# Patient Record
Sex: Female | Born: 1950 | Race: White | Hispanic: No | Marital: Married | Smoking: Former smoker
Health system: Southern US, Community
[De-identification: ages and names within clinical notes are randomized; demographics above are authoritative.]

## PROBLEM LIST (undated history)

## (undated) DIAGNOSIS — F329 Major depressive disorder, single episode, unspecified: Secondary | ICD-10-CM

## (undated) DIAGNOSIS — R0602 Shortness of breath: Secondary | ICD-10-CM

## (undated) DIAGNOSIS — T7840XA Allergy, unspecified, initial encounter: Secondary | ICD-10-CM

## (undated) DIAGNOSIS — K219 Gastro-esophageal reflux disease without esophagitis: Secondary | ICD-10-CM

## (undated) DIAGNOSIS — F32A Depression, unspecified: Secondary | ICD-10-CM

## (undated) HISTORY — DX: Major depressive disorder, single episode, unspecified: F32.9

## (undated) HISTORY — PX: ABDOMINAL HYSTERECTOMY: SHX81

## (undated) HISTORY — DX: Gastro-esophageal reflux disease without esophagitis: K21.9

## (undated) HISTORY — DX: Allergy, unspecified, initial encounter: T78.40XA

## (undated) HISTORY — DX: Shortness of breath: R06.02

## (undated) HISTORY — DX: Depression, unspecified: F32.A

---

## 2003-09-07 ENCOUNTER — Emergency Department (HOSPITAL_COMMUNITY): Admission: EM | Admit: 2003-09-07 | Discharge: 2003-09-07 | Payer: Self-pay

## 2004-02-20 ENCOUNTER — Encounter (INDEPENDENT_AMBULATORY_CARE_PROVIDER_SITE_OTHER): Payer: Self-pay | Admitting: *Deleted

## 2004-02-20 ENCOUNTER — Encounter: Admission: RE | Admit: 2004-02-20 | Discharge: 2004-02-20 | Payer: Self-pay | Admitting: Family Medicine

## 2009-07-18 ENCOUNTER — Encounter: Admission: RE | Admit: 2009-07-18 | Discharge: 2009-07-18 | Payer: Self-pay | Admitting: Family Medicine

## 2011-04-18 IMAGING — CT CT MAXILLOFACIAL W/O CM
3 of 4 series · 16 of 47 positions shown, 19 images · non-contrast
Comparison: None.

CLINICAL DATA: 58-year-old female with chronic sinusitis.
Drainage, sore throat, occasional facial pressure.

CT MAXILLOFACIAL WITHOUT CONTRAST
TECHNIQUE: Multidetector CT imaging of the maxillofacial
structures was performed. Multiplanar CT image reconstructions were
also generated.

[Series 2: ax bone · axial · 0.33mm/px · z∈[-72,+20]mm · 10 of 45 slices shown, 13 images]
[im 4/45  brain]
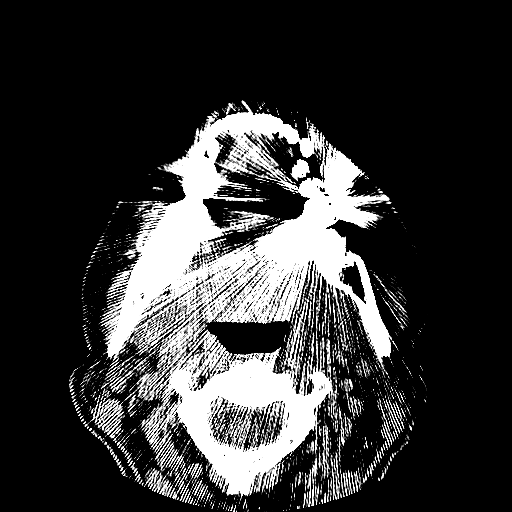
[im 4/45  bone]
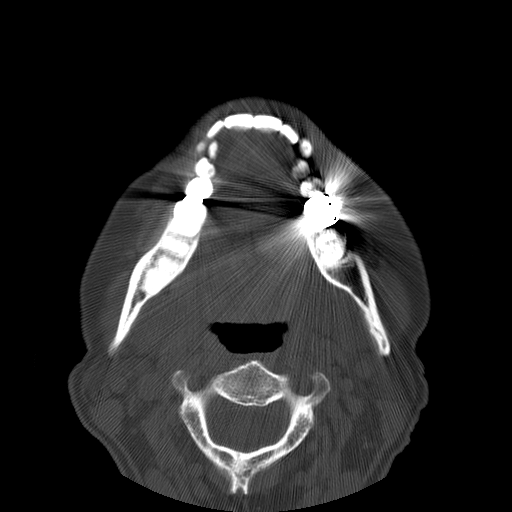
[im 7/45  bone]
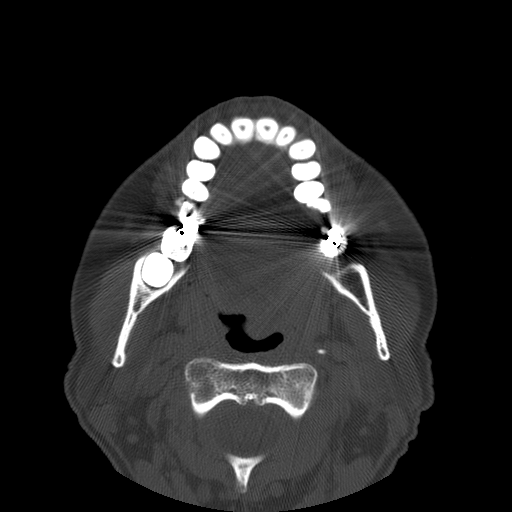
[im 13/45  bone]
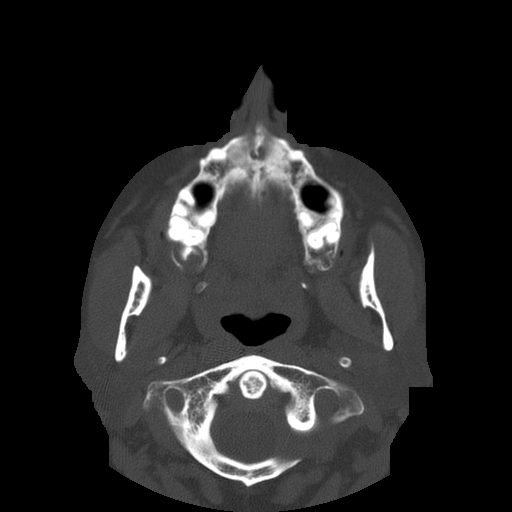
[im 16/45  bone]
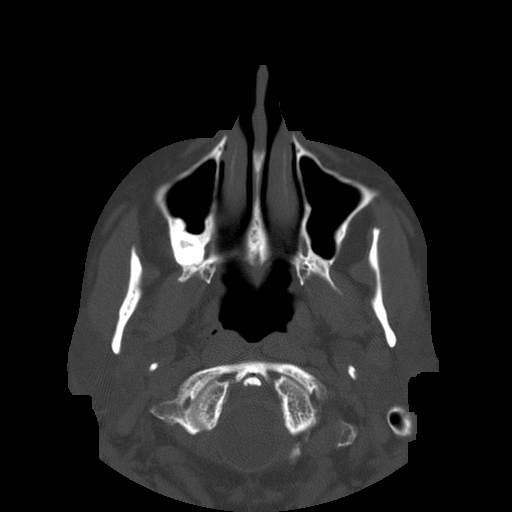
[im 19/45  brain]
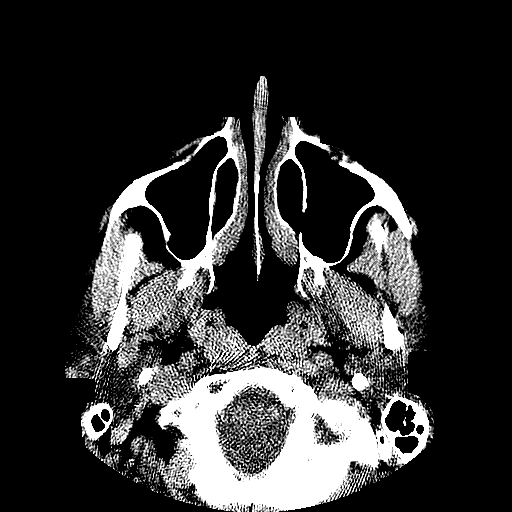
[im 19/45  bone]
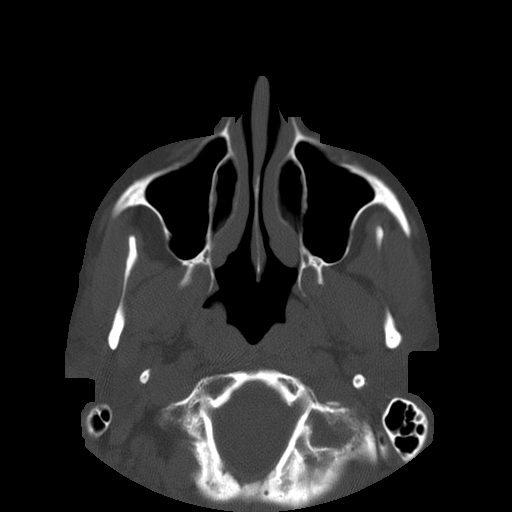
[im 26/45  bone]
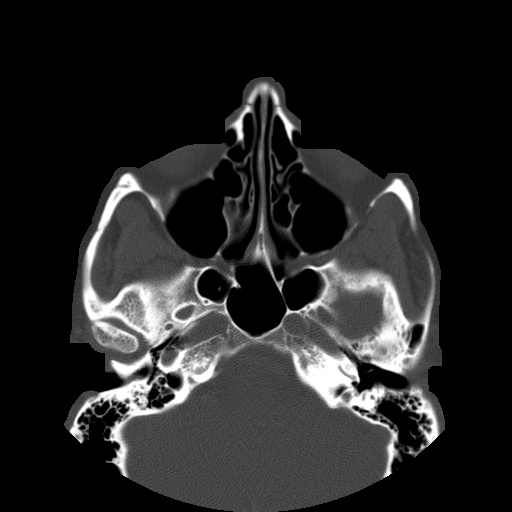
[im 29/45  bone]
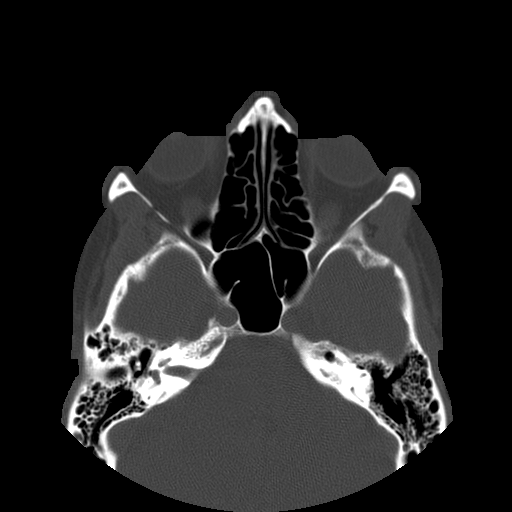
[im 32/45  bone]
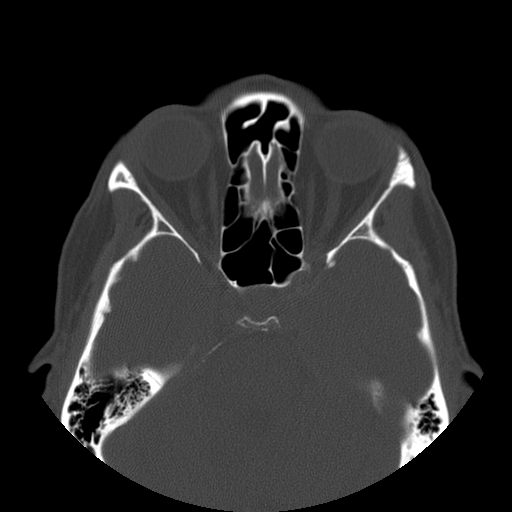
[im 38/45  brain]
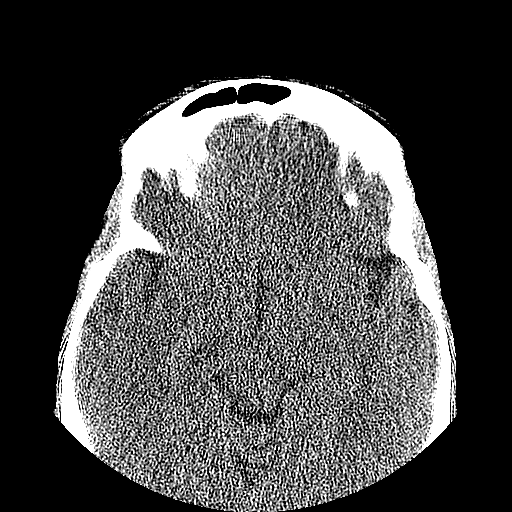
[im 38/45  bone]
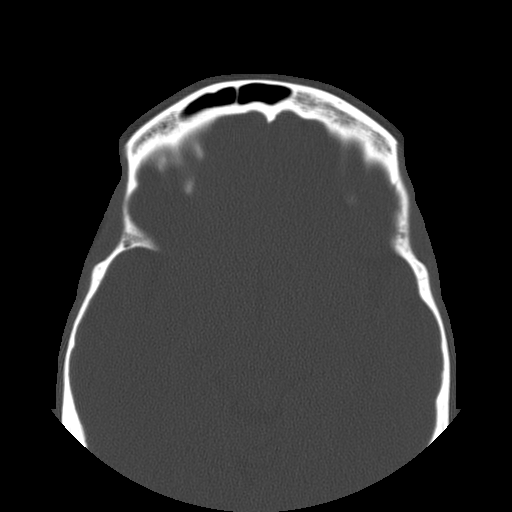
[im 41/45  bone]
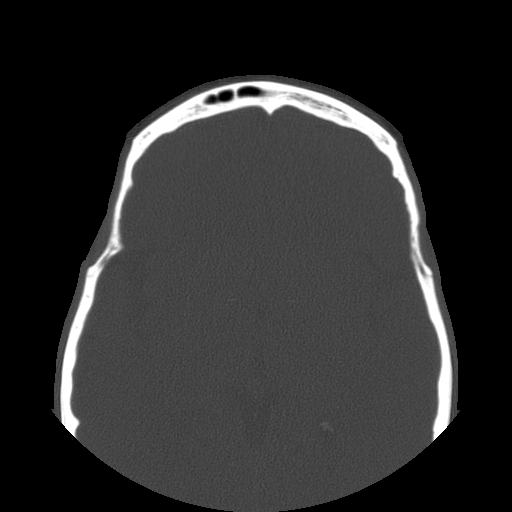

[Series 103: cor soft · coronal · 0.33mm/px · 3 of 59 slices shown]
[im 26/59  bone]
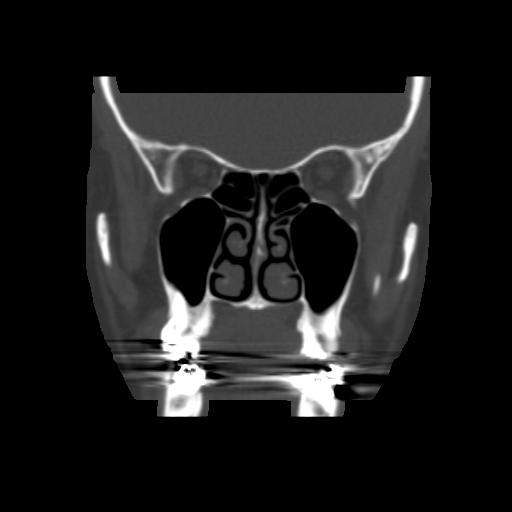
[im 33/59  bone]
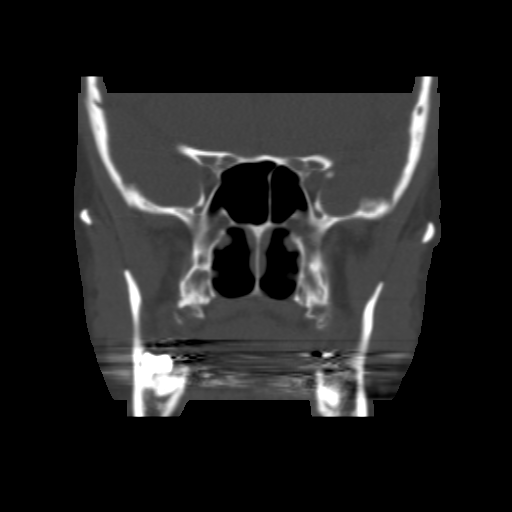
[im 39/59  bone]
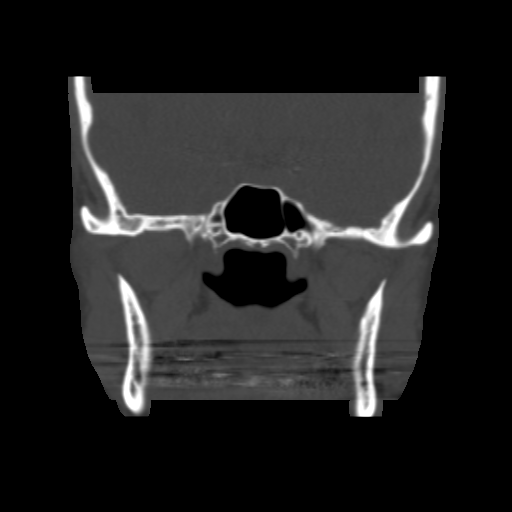

[Series 104: sag soft · sagittal · 0.33mm/px · 3 of 69 slices shown]
[im 23/69  bone]
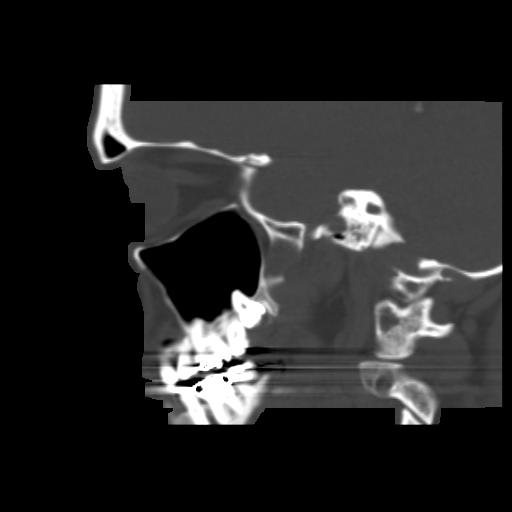
[im 35/69  bone]
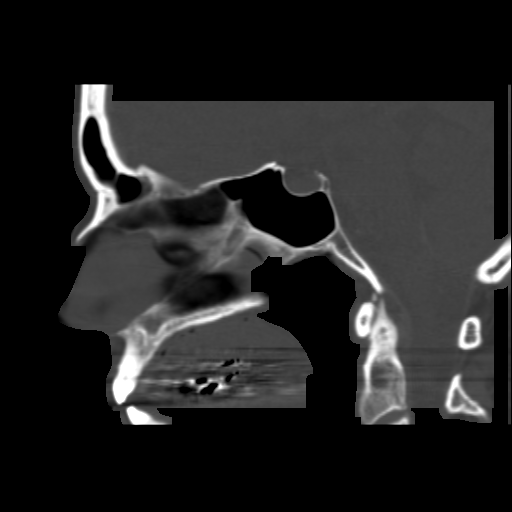
[im 46/69  bone]
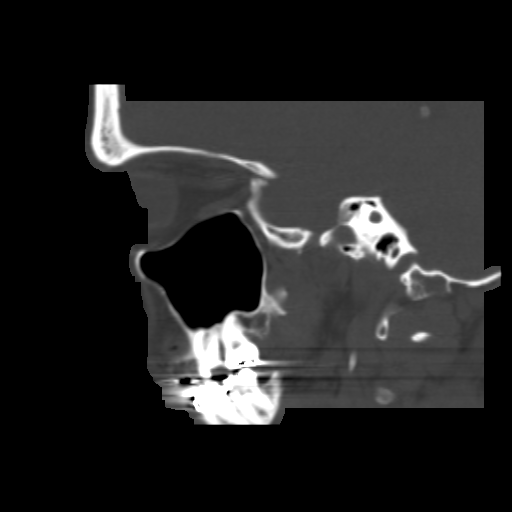

[16 of 47 positions shown; findings below may reference images not displayed]

FINDINGS: Visualized noncontrast brain parenchyma is within normal
limits. Visualized orbits and scalp soft tissues are within normal
limits.  Visualized noncontrast deep soft tissue spaces of the face
are within normal limits.

Paranasal sinuses are clear.  Tympanic cavities and mastoids are
clear. No acute osseous abnormality identified.
IMPRESSION: Normal paranasal sinuses.

## 2011-10-28 ENCOUNTER — Emergency Department (HOSPITAL_COMMUNITY)
Admission: EM | Admit: 2011-10-28 | Discharge: 2011-10-28 | Disposition: A | Discharge status: Home / Self Care | Payer: 59 | Source: Home / Self Care | Attending: Family Medicine | Admitting: Family Medicine

## 2011-10-28 ENCOUNTER — Encounter (HOSPITAL_COMMUNITY): Payer: Self-pay | Admitting: Emergency Medicine

## 2011-10-28 DIAGNOSIS — R001 Bradycardia, unspecified: Secondary | ICD-10-CM

## 2011-10-28 DIAGNOSIS — I498 Other specified cardiac arrhythmias: Secondary | ICD-10-CM

## 2011-10-28 DIAGNOSIS — R42 Dizziness and giddiness: Secondary | ICD-10-CM

## 2011-10-28 DIAGNOSIS — E860 Dehydration: Secondary | ICD-10-CM

## 2011-10-28 LAB — POCT I-STAT, CHEM 8
BUN: 7 mg/dL (ref 6–23)
Calcium, Ion: 1.17 mmol/L (ref 1.13–1.30)
Creatinine, Ser: 0.9 mg/dL (ref 0.50–1.10)
Glucose, Bld: 101 mg/dL — ABNORMAL HIGH (ref 70–99)
HCT: 39 % (ref 36.0–46.0)
Hemoglobin: 13.3 g/dL (ref 12.0–15.0)
Potassium: 3.9 mEq/L (ref 3.5–5.1)
Sodium: 138 mEq/L (ref 135–145)
TCO2: 25 mmol/L (ref 0–100)

## 2011-10-28 MED ORDER — ONDANSETRON HCL 4 MG PO TABS: 4.0000 mg | ORAL_TABLET | Freq: Three times a day (TID) | ORAL | Status: AC | PRN

## 2011-10-28 MED ORDER — LOPERAMIDE HCL 2 MG PO CAPS: 2.0000 mg | ORAL_CAPSULE | Freq: Four times a day (QID) | ORAL | Status: AC | PRN

## 2011-10-28 NOTE — ED Notes (Signed)
Pt having episodes of dizziness that comes and goes starting a few hours ago. Eyes seem a little blurry and out of focus. Got dizzy when getting up to go to the bathroom. Worse when walking and/or standing. Recently MD started weaning her premarin off for a different hormone, she is not sure which. Pt had breakfast today. No c/o nausea, vomiting or diarrhea. Slight stomach ache.

## 2011-10-29 NOTE — ED Provider Notes (Signed)
History     CSN: [id number]  Arrival date & time 10/28/11  1154   First MD Initiated Contact with Patient 10/28/11 1216      Chief Complaint  Patient presents with  . Near Syncope    (Consider location/radiation/quality/duration/timing/severity/associated sxs/prior treatment) HPI Comments: 61 y/o female non smoker and otherwise healthy. Here c/o intermittent episodes of dizziness associated with intermittent blurry vision that started few hours ago while she was at work. Patient reports symptoms are triggered by standing suddenly from sitted position or if walking or standing for long time. Denies associated chest pain, diaphoresis or shortness of breath. No falls or loss of balance, no extremity weakness or numbness, no face droop or difficulty understanding producing speech,  no tinnitus. No dysuria or hematuria. No fever or chills. Patient does reports frequent loose stools (4 times today) in last 2 days, mild abdominal crampying and nausea. Denies vomiting. States she has been very active during last few days traveling to visit son out of state were she was canoeing and doing outdoor activities and she might not been hydrating or protecting properly from sun exposure. She feels thirsty and has dry mouth. Was able to eat breakfast. She is currently being weaned of her hormonal replacement therapy (does not know the name).   History reviewed. No pertinent past medical history.  Past Surgical History  Procedure Date  . Abdominal hysterectomy     History reviewed. No pertinent family history.  History  Substance Use Topics  . Smoking status: Never Smoker   . Smokeless tobacco: Not on file  . Alcohol Use: Yes     occasional    OB History    Grav Para Term Preterm Abortions TAB SAB Ect Mult Living                  Review of Systems  Constitutional: Negative for fever, chills, diaphoresis and appetite change.  HENT: Negative for congestion, sore throat, rhinorrhea, trouble  swallowing, neck pain, sinus pressure and tinnitus.   Eyes: Positive for visual disturbance.  Respiratory: Negative for cough and shortness of breath.   Cardiovascular: Negative for chest pain, palpitations and leg swelling.  Gastrointestinal: Positive for nausea, abdominal pain and diarrhea. Negative for vomiting.  Genitourinary: Negative for dysuria, frequency, hematuria and flank pain.  Skin: Negative for rash.  Neurological: Positive for dizziness. Negative for tremors, seizures, syncope, speech difficulty, weakness, numbness and headaches.    Allergies  Zoloft  Home Medications   Current Outpatient Rx  Name Route Sig Dispense Refill  . LOPERAMIDE HCL 2 MG PO CAPS Oral Take 1 capsule (2 mg total) by mouth 4 (four) times daily as needed for diarrhea or loose stools. 12 capsule 0  . ONDANSETRON HCL 4 MG PO TABS Oral Take 1 tablet (4 mg total) by mouth every 8 (eight) hours as needed for nausea. 10 tablet 0    BP 128/78  Pulse 56  Resp 20  SpO2 98%  Physical Exam  Nursing note and vitals reviewed. Constitutional: She is oriented to person, place, and time. She appears well-developed and well-nourished. No distress.       Orthostatic vitals signs noted. Patient appears comfortable. Able to walk herself to bathroom with no need for assistance and no gait or balance problems.   HENT:  Head: Normocephalic and atraumatic.  Right Ear: External ear normal.  Left Ear: External ear normal.  Nose: Nose normal.  Mouth/Throat: Oropharynx is clear and moist. No oropharyngeal exudate.  Dry lips with mild peeling and cracks in borders around. Inside mouth is moist   Eyes: Conjunctivae and EOM are normal. Pupils are equal, round, and reactive to light. Right eye exhibits no discharge. Left eye exhibits no discharge. No scleral icterus.  Neck: Normal range of motion. Neck supple. No JVD present. No thyromegaly present.  Cardiovascular: Regular rhythm, normal heart sounds and intact  distal pulses.  Exam reveals no gallop and no friction rub.   No murmur heard. Pulmonary/Chest: Effort normal and breath sounds normal. No respiratory distress. She has no wheezes. She has no rales. She exhibits no tenderness.  Abdominal: Soft. Bowel sounds are normal. She exhibits no mass. There is no tenderness. There is no rebound and no guarding.  Lymphadenopathy:    She has no cervical adenopathy.  Neurological: She is alert and oriented to person, place, and time. She has normal strength and normal reflexes. No cranial nerve deficit or sensory deficit. She displays a negative Romberg sign. Coordination and gait normal.       Normal visual fields by comparison  Skin: No rash noted. She is not diaphoretic.  Psychiatric: She has a normal mood and affect.    ED Course  Procedures (including critical care time)  Labs Reviewed  POCT I-STAT, CHEM 8 - Abnormal; Notable for the following:    Glucose, Bld 101 (*)     All other components within normal limits  LAB REPORT - SCANNED   No results found.   1. Dizziness   2. Dehydration   3. Bradycardia    EKG: sinus rithm with ventricular rate of 53 bpm. (sinus bradycardia) impress micro voltage of QRS in V1,V2 and V3 and tall P waves in some leads, but no ST changes or other signs of acute ischemia. No AV or BBB.    MDM  Impress patient symptoms likely related to mild orthostatism due to dehydration related to recent extreme outdoor activities and concomitant mild gastroenteritis. vs positional vertigo. electrolytes are normal. Concerning there is no compensatory increase in her hart rate and she is slightly bradycardic. Patient tolerating oral hydration with no vomiting. Reports feeling well. Discussed with patient the possibility to tranfers her to the emergency department for further evaluation and she prefers outpatient management with close follow up. My impression is that she is clinically well and stable on exam and vital signs by  heart monitor and this rout of care is also appropriate. Red flags that should prompt her return to the emergency department discussed with patient and provided in writing. Prescribed ondansetron and loperamide encouraged oral hydration. Cardiology referral provided.        Sharin Grave, MD 10/29/11 1253

## 2011-10-30 ENCOUNTER — Other Ambulatory Visit: Payer: Self-pay | Admitting: Cardiology

## 2011-10-30 NOTE — Progress Notes (Signed)
Mackenzie Mckinney    Date of visit:  10/30/2011 DOB:  [birth year]    Age:  61 yrs. Medical record number:  [id number]     Account number:  [id number] Primary Care Provider: Fuller Mandril ____________________________ CURRENT DIAGNOSES  1. Syncope  2. Palpitations ____________________________ ALLERGIES  Avelox  Lamisil  Zoloft ____________________________ MEDICATIONS  1. Calcium 500 500 mg calcium (1,250 mg) tablet, 1 p.o. daily  2. Fish Oil 1,000 mg capsule, 1 p.o. daily  3. multivitamin tablet, 1 p.o. daily  4. aspirin 81 mg tablet, chewable, 1 p.o. daily  5. estradiol 1 mg tablet, 1 p.o. daily  6. Glucosamine 500 mg tablet, 1 p.o. daily  7. Vitamin B Complex capsule, 1 p.o. daily  8. biotin 1 mg tablet, 1 p.o. daily ____________________________ CHIEF COMPLAINTS  Rare palp  Syncope ____________________________ HISTORY OF PRESENT ILLNESS  This very nice 61 year old female has been healthy for most of her life except for a remote history of depression. She is seen at the request of Dr. Rosezetta Schlatter for evaluation of dizziness, near syncope and palpitations. She had taken a long driving trip to [place] to visit a friend and was active over the weekend and then was involved in heavy physical exertion with canoeing and handling. When she came back to [facility] she developed a feeling of lightheadedness and near-syncope that improved when she laid her head between her legs. She was seen at an urgent care because the symptoms kept reoccurring and saw Dr. Rosezetta Schlatter yesterday. She feels as if they're getting somewhat better. She has complained of episodic tachycardia and has worn an event monitor in the past. She has not had frank syncope but did feel somewhat nauseous at the time she was having it. She was told previously that she had an abnormal EKG. She denies angina and has no PND, orthopnea or edema.  ____________________________ PAST HISTORY  Past Medical Illnesses:  depression hospitalized in  1990's, denies hypertension or diabetes;  Cardiovascular Illnesses:  no previous history of cardiac disease.;  Surgical Procedures:  hysterectomy;  Cardiology Procedures-Invasive:  no history of prior cardiac procedures;  Cardiology Procedures-Noninvasive:  event monitor September 2013;  LVEF not documented ____________________________ CARDIO-PULMONARY TEST DATES EKG Date:  10/30/2011;  Holter/Event Monitor Date: 10/30/2011;   ____________________________ FAMILY HISTORY Father - age 58,  deceased,  history of CAD, aortic surgery and leukemia; Mother - age 14,  alive and well; Brother 1 - age 70,  alive and well and hyperlipidemia; Brother 2 - age 66,  alive and well and hyperlipidemia;  ____________________________ SOCIAL HISTORY Alcohol Use:  rare;  Smoking:  used to smoke but quit;  Diet:  vegetarian;  Lifestyle:  divorced;  Exercise:  canoe paddle;  Occupation:  Training and development officer - [employer];   ____________________________ REVIEW OF SYSTEMS General:  weight loss without a diet of approximately 5 lbs  Integumentary:  dry skin  Eyes:  dry eyes  Ears, Nose, Throat, Mouth:  denies any hearing loss, epistaxis, hoarseness or difficulty speaking.  Respiratory:  denies dyspnea, cough, wheezing or hemoptysis.  Cardiovascular:  please review HPI  Abdominal:  denies dyspepsia, GI bleeding, constipation, or diarrhea  Genitourinary-Female:  no dysuria, urgency, frequency, UTIs, or stress incontinence  Musculoskeletal:  occasional aching and arthritis that is generalized  Neurological:  occasional headaches  Psychiatric:  history of depression Endocrine:  denies any history of weight change, heat/cold intolerance, polydipsia, or polyuria   Hematological/Immunologic:  denies any food allergies, bleeding disorders. ____________________________ PHYSICAL EXAMINATION VITAL SIGNS  Blood  Pressure:  114/64 Sitting, Left arm, regular cuff  , 118/70 Standing, Left arm and regular cuff , 100/60 standing retaken by M.D.   Pulse:  68/min. Weight:  119.00 lbs. Height:  63"BMI: 21  Constitutional:  cooperative, alert and oriented,well developed, well nourished, in no acute distress. Skin:  warm and dry to touch, no apparent skin lesions, or masses noted. Head:  normocephalic, normal hair pattern, no masses or tenderness Eyes:  EOMS Intact, PERRLA, C and S clear, Funduscopic exam not done. ENT:  ears, nose and throat reveal no gross abnormalities.  Dentition good. Neck:  supple, without massess. No JVD, thyromegaly or carotid bruits. Carotid upstroke normal. Chest:  normal symmetry, clear to auscultation and percussion. Cardiac:  regular rhythm, normal S1 and S2, No S3 or S4, no murmurs, gallops or rubs detected. Abdomen:  abdomen soft,non-tender, no masses, no hepatospenomegaly, or aneurysm noted Peripheral Pulses:  the femoral,dorsalis pedis, and posterior tibial pulses are full and equal bilaterally with no bruits auscultated. Extremities & Back:  no deformities, clubbing, cyanosis, erythema or edema observed. Normal muscle strength and tone. Neurological:  no gross motor or sensory deficits noted, affect appropriate, oriented x3. ____________________________ IMPRESSIONS/PLAN  1. Episodic dizziness and near syncope that may reflect some dehydration and a mild degree of orthostasis 2. Sinus bradycardia 3. Remote history of depression  Recommendations:  This may purely be the fact that she has a low normal blood pressure and may have been somewhat dehydrated. I encouraged her to drink at least 16 ounces of liquids first thing in the morning and that she have a event monitor to determine if she is having any bradycardia that could be associated with symptoms of lightheadedness or dizziness. Also like her to have an echocardiogram to evaluate whether she has any structural heart disease. Her EKG shows sinus bradycardia, low voltage in the limb leads, and a borderline left axis. I will see her back in a  month. ____________________________ TODAYS ORDERS  1. 12 Lead EKG: Today  2. 2D, color flow, doppler: First Available  3. [place]: Today  4. Return Visit: 1 month                       ____________________________ Cardiology Physician:  Darden Palmer MD [facility]

## 2011-11-18 ENCOUNTER — Ambulatory Visit: Payer: 59 | Admitting: Cardiovascular Disease

## 2014-04-11 ENCOUNTER — Institutional Professional Consult (permissible substitution): Payer: Self-pay | Admitting: Internal Medicine

## 2014-04-13 ENCOUNTER — Encounter: Payer: Self-pay | Admitting: Internal Medicine

## 2014-04-13 ENCOUNTER — Ambulatory Visit (INDEPENDENT_AMBULATORY_CARE_PROVIDER_SITE_OTHER)
Admission: RE | Admit: 2014-04-13 | Discharge: 2014-04-13 | Disposition: A | Discharge status: Home / Self Care | Payer: BLUE CROSS/BLUE SHIELD | Source: Ambulatory Visit | Attending: Internal Medicine | Admitting: Internal Medicine

## 2014-04-13 ENCOUNTER — Other Ambulatory Visit (INDEPENDENT_AMBULATORY_CARE_PROVIDER_SITE_OTHER): Payer: BLUE CROSS/BLUE SHIELD

## 2014-04-13 ENCOUNTER — Encounter (INDEPENDENT_AMBULATORY_CARE_PROVIDER_SITE_OTHER): Payer: Self-pay

## 2014-04-13 ENCOUNTER — Ambulatory Visit (INDEPENDENT_AMBULATORY_CARE_PROVIDER_SITE_OTHER): Payer: BLUE CROSS/BLUE SHIELD | Admitting: Internal Medicine

## 2014-04-13 VITALS — BP 106/70 | HR 60 | Ht 63.5 in | Wt 125.8 lb

## 2014-04-13 DIAGNOSIS — R0600 Dyspnea, unspecified: Secondary | ICD-10-CM

## 2014-04-13 LAB — BRAIN NATRIURETIC PEPTIDE: PRO B NATRI PEPTIDE: 49 pg/mL (ref 0.0–100.0)

## 2014-04-13 MED ORDER — FAMOTIDINE 20 MG PO TABS: ORAL_TABLET | ORAL | Status: DC

## 2014-04-13 MED ORDER — PANTOPRAZOLE SODIUM 40 MG PO TBEC: 40.0000 mg | DELAYED_RELEASE_TABLET | Freq: Every day | ORAL | Status: DC

## 2014-04-13 NOTE — Progress Notes (Signed)
Subjective:    Patient ID: Mackenzie Mckinney, female    DOB: 10/08/1950,    MRN: [id number]  HPI  89 yowf quit smoking around 2008 with onset 1990s variable rhinitis s real pattern but worse since around 2005 otcs help some then onset of doe in Oct 2015 so referred to pulmonary clinic  by Dr Marlyn Corporal 04/13/2014.    04/13/2014 f/u ov/[address] re:  Chief Complaint  Patient presents with  . Pulmonary Consult    Referred by Dr. Tollie Pizza. Pt c/o DOE for the past 3-4 months. Occurs when she walks up stairs.  She also c/o heartburn that comes and goes for the past month. She has cough- non prod mainly when she lies down.   first noted carrying groceries to 3rd floor appt and some worse since first noticed assoc with fatigue and hb and watery rhinitis/ cough worse at hs  No problem walking on a flat surface at nl pace   No obvious   day to day or daytime variabilty or assoc   cp or chest tightness, subjective wheeze overt sinus or hb symptoms. No unusual exp hx or h/o childhood pna/ asthma or knowledge of premature birth.  Sleeping ok without nocturnal  or early am exacerbation  of respiratory  c/o's or need for noct saba. Also denies any obvious fluctuation of symptoms with weather or environmental changes or other aggravating or alleviating factors except as outlined above   Current Medications, Allergies, Complete Past Medical History, Past Surgical History, Family History, and Social History were reviewed in [employer] record.             Review of Systems  Constitutional: Negative for fever, chills and unexpected weight change.  HENT: Positive for postnasal drip. Negative for congestion, dental problem, ear pain, nosebleeds, rhinorrhea, sinus pressure, sneezing, sore throat, trouble swallowing and voice change.   Eyes: Negative for visual disturbance.  Respiratory: Positive for cough and shortness of breath. Negative for choking.   Cardiovascular: Negative for chest  pain and leg swelling.  Gastrointestinal: Negative for vomiting, abdominal pain and diarrhea.  Genitourinary: Negative for difficulty urinating.       Acid heartburn  Musculoskeletal: Negative for arthralgias.  Skin: Negative for rash.  Neurological: Negative for tremors, syncope and headaches.  Hematological: Does not bruise/bleed easily.       Objective:   Physical Exam amb wm nad   Wt Readings from Last 3 Encounters:  04/13/14 125 lb 12.8 oz (57.063 kg)    Vital signs reviewed   HEENT: nl dentition, turbinates, and orophanx. Nl external ear canals without cough reflex   NECK :  without JVD/Nodes/TM/ nl carotid upstrokes bilaterally   LUNGS: no acc muscle use, clear to A and P bilaterally without cough on insp or exp maneuvers   CV:  RRR  no s3 or murmur or increase in P2, no edema   ABD:  soft and nontender with nl excursion in the supine position. No bruits or organomegaly, bowel sounds nl  MS:  warm without deformities, calf tenderness, cyanosis or clubbing  SKIN: warm and dry without lesions    NEURO:  alert, approp, no deficits     CXR PA and Lateral:   04/13/2014 :     I personally reviewed images and agree with radiology impression as follows:   There is a scoliosis deformity. Pectus deformity is also noted. Heart size is normal. There is no pleural effusion or edema. No airspace consolidation.  Labs ordered/ reviewed  Allergy profile Lab Results  Component Value Date   PROBNP 49.0 04/13/2014     labs 04/02/14 nl hgb, 0.2 Eos,  HC03 27        Assessment & Plan:

## 2014-04-13 NOTE — Patient Instructions (Addendum)
Please remember to go to the lab and x-ray department downstairs for your tests - we will call you with the results when they are available.    Pantoprazole (protonix) 40 mg   Take 30-60 min before first meal of the day and Pepcid 20 mg one bedtime until return to office - this is the best way to tell whether stomach acid is contributing to your problem.    Add zyrtec 10 mg in pm to see if helps drippy nose without making you too drowsy  GERD (REFLUX)  is an extremely common cause of respiratory symptoms just like yours , many times with no obvious heartburn at all.    It can be treated with medication, but also with lifestyle changes including avoidance of late meals, excessive alcohol, smoking cessation, and avoid fatty foods, chocolate, peppermint, colas, red wine, and acidic juices such as orange juice.  NO MINT OR MENTHOL PRODUCTS SO NO COUGH DROPS  USE SUGARLESS CANDY INSTEAD (Jolley ranchers or Stover's or Life Savers) or even ice chips will also do - the key is to swallow to prevent all throat clearing. NO OIL BASED VITAMINS - use powdered substitutes - stop the fish oil x one month and eat more fish  Please schedule a follow up office visit in 4 weeks, sooner if needed with pfts on return

## 2014-04-14 NOTE — Assessment & Plan Note (Signed)
Symptoms are markedly disproportionate to objective findings and not clear this is a lung problem but pt does appear to have difficult airway management issues. DDX of  difficult airways management all start with A and  include Adherence, Ace Inhibitors, Acid Reflux, Active Sinus Disease, Alpha 1 Antitripsin deficiency, Anxiety masquerading as Airways dz,  ABPA,  allergy(esp in young), Aspiration (esp in elderly), Adverse effects of DPI,  Active smokers, plus two Bs  = Bronchiectasis and Beta blocker use..and one C= CHF  Adherence is always the initial "prime suspect" and is a multilayered concern that requires a "trust but verify" approach in every patient - starting with knowing how to use medications, especially inhalers, correctly, keeping up with refills and understanding the fundamental difference between maintenance and prns vs those medications only taken for a very short course and then stopped and not refilled.   ? Acid (or non-acid) GERD > always difficult to exclude as up to 75% of pts in some series report no assoc GI/ Heartburn symptoms and she has a cough which can cause secondary gerd > rec max (24h)  acid suppression and diet restrictions/ reviewed and instructions given in writing.   ? Active sinus dz > try zyrtec / check allergy profile ? Sinus ct next  ? chf > very unlikely with such a low bnp   Will return for pfts next

## 2014-04-16 LAB — ALLERGY FULL PROFILE
Allergen, D pternoyssinus,d7: <0.1 kU/L
Alternaria Alternata: <0.1 kU/L
Aspergillus fumigatus, m3: <0.1 kU/L
Bahia Grass: <0.1 kU/L
Bermuda Grass: <0.1 kU/L
Candida Albicans: <0.1 kU/L
Common Ragweed: <0.1 kU/L
Curvularia lunata: <0.1 kU/L
D. farinae: <0.1 kU/L
Dog Dander: <0.1 kU/L
Elm IgE: <0.1 kU/L
G005 Rye, Perennial: <0.1 kU/L
G009 Red Top: <0.1 kU/L
Goldenrod: <0.1 kU/L
Helminthosporium halodes: <0.1 kU/L
House Dust Hollister: <0.1 kU/L
IgE (Immunoglobulin E), Serum: 12 kU/L (ref <115)
Lamb's Quarters: <0.1 kU/L
Oak: <0.1 kU/L
Plantain: <0.1 kU/L
Stemphylium Botryosum: <0.1 kU/L
Sycamore Tree: <0.1 kU/L
Timothy Grass: <0.1 kU/L

## 2014-04-16 NOTE — Progress Notes (Signed)
Quick Note:  Spoke with pt and notified of results per Dr. Wert. Pt verbalized understanding and denied any questions.  ______ 

## 2014-05-11 ENCOUNTER — Ambulatory Visit: Payer: BLUE CROSS/BLUE SHIELD | Admitting: Internal Medicine

## 2014-06-01 ENCOUNTER — Encounter (INDEPENDENT_AMBULATORY_CARE_PROVIDER_SITE_OTHER): Payer: Self-pay

## 2014-06-01 ENCOUNTER — Ambulatory Visit (INDEPENDENT_AMBULATORY_CARE_PROVIDER_SITE_OTHER): Payer: BLUE CROSS/BLUE SHIELD | Admitting: Internal Medicine

## 2014-06-01 DIAGNOSIS — R0600 Dyspnea, unspecified: Secondary | ICD-10-CM

## 2014-06-01 LAB — PULMONARY FUNCTION TEST
DL/VA % pred: 77 %
DL/VA: 3.63 ml/min/mmHg/L
DLCO unc % pred: 61 %
DLCO unc: 14.15 ml/min/mmHg
FEF 25-75 Post: 1.85 L/sec
FEF 25-75 Pre: 1.51 L/sec
FEF2575-%CHANGE-POST: 22 %
FEF2575-%PRED-POST: 89 %
FEF2575-%Pred-Pre: 73 %
FEV1-%Change-Post: 4 %
FEV1-%Pred-Post: 87 %
FEV1-%Pred-Pre: 83 %
FEV1-PRE: 1.92 L
FEV1-Post: 2.01 L
FEV1FVC-%Change-Post: 5 %
FEV1FVC-%PRED-PRE: 98 %
FEV6-%Change-Post: -1 %
FEV6-%Pred-Post: 85 %
FEV6-%Pred-Pre: 86 %
FEV6-Post: 2.48 L
FEV6-Pre: 2.52 L
FEV6FVC-%Pred-Post: 104 %
FEV6FVC-%Pred-Pre: 104 %
FVC-%CHANGE-POST: -1 %
FVC-%Pred-Post: 82 %
FVC-%Pred-Pre: 83 %
FVC-Post: 2.49 L
FVC-Pre: 2.52 L
POST FEV1/FVC RATIO: 81 %
PRE FEV1/FVC RATIO: 76 %
PRE FEV6/FVC RATIO: 100 %
Post FEV6/FVC ratio: 100 %
RV % pred: 74 %
RV: 1.53 L
TLC % pred: 82 %
TLC: 4.03 L

## 2014-06-01 NOTE — Progress Notes (Signed)
PFT done today. 

## 2014-06-04 ENCOUNTER — Encounter: Payer: Self-pay | Admitting: Internal Medicine

## 2014-06-04 ENCOUNTER — Ambulatory Visit (INDEPENDENT_AMBULATORY_CARE_PROVIDER_SITE_OTHER): Payer: BLUE CROSS/BLUE SHIELD | Admitting: Internal Medicine

## 2014-06-04 VITALS — BP 102/60 | HR 54 | Ht 63.0 in | Wt 129.0 lb

## 2014-06-04 DIAGNOSIS — R0600 Dyspnea, unspecified: Secondary | ICD-10-CM | POA: Diagnosis not present

## 2014-06-04 NOTE — Progress Notes (Signed)
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   History of Present Illness  04/13/2014 f/u ov/Mackenzie Mckinney re:  Chief Complaint  Patient presents with  . Pulmonary Consult    Referred by Dr. Tollie Pizza. Pt c/o DOE for the past 3-4 months. Occurs when she walks up stairs.  She also c/o heartburn that comes and goes for the past month. She has cough- non prod mainly when she lies down.   first noted carrying groceries to 3rd floor appt and some worse since first noticed assoc with fatigue and hb and watery rhinitis/ cough worse at hs  No problem walking on a flat surface at nl pace  rec Please remember to go to the lab and x-ray department downstairs for your tests - we will call you with the results when they are available. Pantoprazole (protonix) 40 mg   Take 30-60 min before first meal of the day and Pepcid 20 mg one bedtime until return to office - this is the best way to tell whether stomach acid is contributing to your problem.   Add zyrtec 10 mg in pm to see if helps drippy nose without making you too drowsy GERD diet    06/04/2014 f/u ov/Mackenzie Mckinney re: unexplained sob/ assoc pnds on zyrtec and gerd rx  Chief Complaint  Patient presents with  . Follow-up    Pt states breathing is "a little bit better". She states that she is not coughing and clearing her throat.    very stressed re mother's dementia and not sleeping well  No obvious day to day or daytime variabilty or assoc chronic cough or cp or chest tightness, subjective wheeze overt sinus or hb symptoms. No unusual exp hx or h/o childhood pna/ asthma or knowledge of premature birth.  Sleeping ok without nocturnal  or early am exacerbation  of respiratory   c/o's or need for noct saba. Also denies any obvious fluctuation of symptoms with weather or environmental changes or other aggravating or alleviating factors except as outlined above   Current Medications, Allergies, Complete Past Medical History, Past Surgical History, Family History, and Social History were reviewed in [employer] record.  ROS  The following are not active complaints unless bolded sore throat, dysphagia, dental problems, itching, sneezing,  nasal congestion or excess/ purulent secretions, ear ache,   fever, chills, sweats, unintended wt loss, pleuritic or exertional cp, hemoptysis,  orthopnea pnd or leg swelling, presyncope, palpitations, heartburn, abdominal pain, anorexia, nausea, vomiting, diarrhea  or change in bowel or urinary habits, change in stools or urine, dysuria,hematuria,  rash, arthralgias, visual complaints, headache, numbness weakness or ataxia or problems with walking or coordination,  change in mood/affect or memory.                          Objective:   Physical Exam  amb wm nad   Wt Readings from Last 3 Encounters:  06/04/14 129 lb (58.514 kg)  04/13/14 125 lb 12.8 oz (57.063 kg)    Vital signs reviewed    HEENT: nl dentition, turbinates, and orophanx. Nl external ear canals without cough reflex  NECK :  without JVD/Nodes/TM/ nl carotid upstrokes bilaterally   LUNGS: no acc muscle use, clear to A and P bilaterally without cough on insp or exp maneuvers   CV:  RRR  no s3 or murmur or increase in P2, no edema   ABD:  soft and nontender with nl excursion in the supine position. No bruits or organomegaly, bowel sounds nl  MS:  warm without deformities, calf tenderness, cyanosis or clubbing  SKIN: warm and dry without lesions    NEURO:  alert, approp, no deficits     CXR PA and Lateral:   04/13/2014 :     I personally reviewed images and agree with radiology impression as follows:   There is a scoliosis  deformity. Pectus deformity is also noted. Heart size is normal. There is no pleural effusion or edema. No airspace consolidation.    Labs ordered/ reviewed  Allergy profile neg  Lab Results  Component Value Date   PROBNP 49.0 04/13/2014     labs 04/02/14 nl hgb, 0.2 Eos,  HC03 27        Assessment & Plan:

## 2014-06-04 NOTE — Assessment & Plan Note (Addendum)
-   04/13/14  Allergy profile IgE 14 , neg RAST - PFTs 07/01/14 nl except dlco 61 corrects to 77%  - 06/04/2014  Walked RA x 3 laps @ 185 ft each stopped due to  End of study/ moderate pace/ no desats/sob/ cp     I had an extended discussion with the patient reviewing all relevant studies completed to date and  lasting 15 to 20 minutes of a 25 minute visit on the following ongoing concerns:  1) poor sleep hygiene  May be contributing to fatigue/ reviewed  2) reconditioning should be tried before cpst next step  3) ok to try non-sedating antihistamines but no change gerd rx for at least a couple of weeks after change to allegra to avoid confusion re cause/ effects of meds  4) pulmonary f/u is prn

## 2014-06-04 NOTE — Patient Instructions (Signed)
Try allegra instead of zyrtec to see if it makes any difference before stopping acid medication   To get the most out of exercise, you need to be continuously aware that you are short of breath, but never out of breath, for 30 minutes daily. As you improve, it will actually be easier for you to do the same amount of exercise  in  30 minutes so always push to the level where you are short of breath.     If this does not result in gradual  Improvement after several weeks then call for CPST ask [facility] to schedule it

## 2015-11-12 ENCOUNTER — Other Ambulatory Visit: Payer: Self-pay | Admitting: Family Medicine

## 2017-03-10 ENCOUNTER — Encounter: Payer: Self-pay | Admitting: Gastroenterology

## 2017-03-17 ENCOUNTER — Encounter: Payer: Self-pay | Admitting: Gastroenterology

## 2017-03-17 ENCOUNTER — Ambulatory Visit (INDEPENDENT_AMBULATORY_CARE_PROVIDER_SITE_OTHER): Payer: Managed Care, Other (non HMO) | Admitting: Gastroenterology

## 2017-03-17 VITALS — BP 114/60 | HR 64 | Ht 63.0 in | Wt 123.4 lb

## 2017-03-17 DIAGNOSIS — Z1211 Encounter for screening for malignant neoplasm of colon: Secondary | ICD-10-CM | POA: Diagnosis not present

## 2017-03-17 DIAGNOSIS — R110 Nausea: Secondary | ICD-10-CM

## 2017-03-17 DIAGNOSIS — R1013 Epigastric pain: Secondary | ICD-10-CM | POA: Diagnosis not present

## 2017-03-17 DIAGNOSIS — K219 Gastro-esophageal reflux disease without esophagitis: Secondary | ICD-10-CM

## 2017-03-17 DIAGNOSIS — R945 Abnormal results of liver function studies: Secondary | ICD-10-CM

## 2017-03-17 DIAGNOSIS — R7989 Other specified abnormal findings of blood chemistry: Secondary | ICD-10-CM | POA: Insufficient documentation

## 2017-03-17 MED ORDER — RANITIDINE HCL 150 MG PO CAPS: 150.0000 mg | ORAL_CAPSULE | Freq: Every evening | ORAL | 2 refills | Status: AC

## 2017-03-17 MED ORDER — OMEPRAZOLE 40 MG PO CPDR: 40.0000 mg | DELAYED_RELEASE_CAPSULE | Freq: Two times a day (BID) | ORAL | 2 refills | Status: DC

## 2017-03-17 MED ORDER — PEG 3350-KCL-NABCB-NACL-NASULF 236 G PO SOLR: 4000.0000 mL | Freq: Once | ORAL | 0 refills | Status: AC

## 2017-03-17 NOTE — Progress Notes (Addendum)
03/17/2017 Mackenzie Mckinney [id number] 06/20/1950   HISTORY OF PRESENT ILLNESS:  This is a 67 year old female who is new to out practice and has been referred here by her PCP, Bing Matter, PA-C, for evaluation regarding reflux, epigastric abdominal pain, and nausea.  The patient tells me that her symptoms began about 6 months ago.  She says that she has had a lot of exposure to black mold at work and that is when a lot of her issues began.  She began having a lot of sinus issues and sinus drainage and she is wondering if maybe this kicked up her acid reflux and GI issues.  Complains of a lot of severe reflux with nausea and some epigastric abdominal pain.  Previously had some mild reflux issues in the past, but nothing nearly to this degree.  She is currently taking omeprazole 20 mg daily and Zantac 150 mg daily, but has still not been feeling better.  Has never undergone colonoscopy in the past.  She says that she had a couple of green colored stools recently but otherwise no issues with moving her bowels or changes in bowel habits.  Also noted to have slight elevation in LFT's with AST 45 and ALT 74.  Total bili and ALP normal.  Were slightly elevated on one other occasion in 2015 as well.    Past Medical History:  Diagnosis Date  . Depression    Past Surgical History:  Procedure Laterality Date  . ABDOMINAL HYSTERECTOMY      reports that she quit smoking about 11 years ago. Her smoking use included cigarettes. She has a 30.00 pack-year smoking history. she has never used smokeless tobacco. She reports that she does not drink alcohol or use drugs. family history includes Asthma in her daughter; Breast cancer in her mother; Heart disease in her father; Ovarian cancer in her maternal aunt. Allergies  Allergen Reactions  . Zoloft [Sertraline Hcl]     "spacey"  . Lamisil [Terbinafine] Rash      Outpatient Encounter Medications as of 03/17/2017  Medication Sig  . bismuth  subsalicylate (PEPTO BISMOL) 262 MG/15ML suspension Take 30 mLs by mouth daily.  . diphenhydrAMINE (BENADRYL) 25 MG tablet Take 25 mg by mouth at bedtime.  . fexofenadine (ALLEGRA) 180 MG tablet Take by mouth.  . Melatonin 5 MG TABS Take by mouth at bedtime.  Marland Kitchen omeprazole (PRILOSEC) 20 MG capsule Take by mouth.  . ranitidine (ZANTAC) 150 MG capsule Take by mouth.  . [DISCONTINUED] aspirin 81 MG tablet Take 81 mg by mouth daily.  . [DISCONTINUED] BIOTIN PO Take 1 capsule by mouth daily.  . [DISCONTINUED] CALCIUM PO Take 1 capsule by mouth daily.  . [DISCONTINUED] cetirizine (ZYRTEC) 10 MG tablet Take 10 mg by mouth daily.  . [DISCONTINUED] estradiol (ESTRACE) 0.5 MG tablet 1/2 tablet daily  . [DISCONTINUED] famotidine (PEPCID) 20 MG tablet One at bedtime  . [DISCONTINUED] glucosamine-chondroitin 500-400 MG tablet Take 1 tablet by mouth daily.  . [DISCONTINUED] Multiple Vitamin (MULTIVITAMIN) capsule Take 1 capsule by mouth daily.  . [DISCONTINUED] pantoprazole (PROTONIX) 40 MG tablet Take 1 tablet (40 mg total) by mouth daily. Take 30-60 min before first meal of the day   No facility-administered encounter medications on file as of 03/17/2017.      REVIEW OF SYSTEMS  : All other systems reviewed and negative except where noted in the History of Present Illness.   PHYSICAL EXAM: BP 114/60   Pulse 64  Ht 5\' 3"  (1.6 m)   Wt 123 lb 6 oz (56 kg)   BMI 21.85 kg/m  General: Well developed white female in no acute distress Head: Normocephalic and atraumatic Eyes:  Sclerae anicteric, conjunctiva pink. Ears: Normal auditory acuity Lungs: Clear throughout to auscultation; no increased WOB. Heart: Regular rate and rhythm; no M/R/G. Abdomen: Soft, non-distended.  BS present.  Mild epigastric TTP. Rectal:  Will be done at the time of colonoscopy. Musculoskeletal: Symmetrical with no gross deformities  Skin: No lesions on visible extremities Extremities: No edema  Neurological: Alert  oriented x 4, grossly non-focal Psychological:  Alert and cooperative. Normal mood and affect  ASSESSMENT AND PLAN: *GERD with epigastric abdominal pain and nausea:  Will schedule for EGD for evaluation, but I am also going to change her medication regimen.  Will have her increase omeprazole to 40 mg BID and will take zanatc 150 mg at bedtime for now. *Elevated LFT's:  Mild elevation of AST and ALT and has had mild elevation on another occasion in the past.  May be fatty liver.  Will check abdominal ultrasound. *Screening colonoscopy:  Will schedule with Dr. Ardis Hughs as she's never had one in the past.  **The risks, benefits, and alternatives to EGD and colonoscopy were discussed with the patient and she consents to proceed.       CC:  Stephens Shire, MD CC:  Dr. Deatra Ina

## 2017-03-17 NOTE — Patient Instructions (Signed)
You have been scheduled for an abdominal ultrasound at [facility] (1st floor of hospital) on 03-22-17 at 10:00 am. Please arrive 15 minutes prior to your appointment for registration. Make certain not to have anything to eat or drink 6 hours prior to your appointment. Should you need to reschedule your appointment, please contact radiology at [phone]. This test typically takes about 30 minutes to perform.   We have sent the following medications to your pharmacy for you to pick up at your convenience: Omeprazole 40 mg twice a day   Zantac 150 mg at bedtime   You have been scheduled for an endoscopy and colonoscopy. Please follow the written instructions given to you at your visit today. Please pick up your prep supplies at the pharmacy within the next 1-3 days. If you use inhalers (even only as needed), please bring them with you on the day of your procedure. Your physician has requested that you go to www.startemmi.com and enter the access code given to you at your visit today. This web site gives a general overview about your procedure. However, you should still follow specific instructions given to you by our office regarding your preparation for the procedure.

## 2017-03-18 NOTE — Progress Notes (Signed)
I agree with the above note, plan 

## 2017-03-19 ENCOUNTER — Telehealth: Payer: Self-pay | Admitting: Emergency Medicine

## 2017-03-19 NOTE — Telephone Encounter (Signed)
Pts insurance will not cover omeprazole twice a day only once daily.

## 2017-03-22 ENCOUNTER — Ambulatory Visit (HOSPITAL_COMMUNITY): Payer: Managed Care, Other (non HMO)

## 2017-03-22 NOTE — Telephone Encounter (Signed)
Can we try for pantoprazole 40 mg BID to see if they will cover that instead?  Thank you,  Jess

## 2017-03-23 MED ORDER — PANTOPRAZOLE SODIUM 40 MG PO TBEC: 40.0000 mg | DELAYED_RELEASE_TABLET | Freq: Two times a day (BID) | ORAL | 3 refills | Status: DC

## 2017-03-23 NOTE — Telephone Encounter (Signed)
Sent pantoprazole to pharmacy to see if it is covered by insurance.

## 2017-03-24 ENCOUNTER — Other Ambulatory Visit: Payer: Self-pay | Admitting: Emergency Medicine

## 2017-03-29 ENCOUNTER — Telehealth: Payer: Self-pay | Admitting: Gastroenterology

## 2017-03-30 NOTE — Telephone Encounter (Signed)
Spoke to patient she states she already bought the OTC Prilosec (20 mg) and has been taking that twice a day which has helped. She feels that taking 80 mg daily might be too much. She states she might take two 20 mg in the morning and then one 20 mg pill before dinner and see if that helps.

## 2017-03-30 NOTE — Telephone Encounter (Signed)
Will they cover anything twice a day?  If not then just do omeprazole 40 mg once daily and she can get OTC omeprazole and take 2 of them as her second dose.  Thank you,  Jess

## 2017-03-30 NOTE — Telephone Encounter (Signed)
Insurance will not cover Pantoprazole either. Please advise.

## 2017-04-02 ENCOUNTER — Encounter: Payer: Self-pay | Admitting: Gastroenterology

## 2017-04-11 ENCOUNTER — Encounter: Payer: Self-pay | Admitting: Gastroenterology

## 2017-04-16 ENCOUNTER — Other Ambulatory Visit: Payer: Self-pay

## 2017-04-16 ENCOUNTER — Encounter: Payer: Self-pay | Admitting: Gastroenterology

## 2017-04-16 ENCOUNTER — Ambulatory Visit (AMBULATORY_SURGERY_CENTER): Payer: Managed Care, Other (non HMO) | Admitting: Gastroenterology

## 2017-04-16 VITALS — BP 139/64 | HR 63 | Temp 98.6°F | Resp 9 | Ht 63.0 in | Wt 123.0 lb

## 2017-04-16 DIAGNOSIS — K5730 Diverticulosis of large intestine without perforation or abscess without bleeding: Secondary | ICD-10-CM | POA: Diagnosis not present

## 2017-04-16 DIAGNOSIS — Z1212 Encounter for screening for malignant neoplasm of rectum: Secondary | ICD-10-CM

## 2017-04-16 DIAGNOSIS — D122 Benign neoplasm of ascending colon: Secondary | ICD-10-CM | POA: Diagnosis not present

## 2017-04-16 DIAGNOSIS — K219 Gastro-esophageal reflux disease without esophagitis: Secondary | ICD-10-CM

## 2017-04-16 DIAGNOSIS — D120 Benign neoplasm of cecum: Secondary | ICD-10-CM

## 2017-04-16 DIAGNOSIS — D124 Benign neoplasm of descending colon: Secondary | ICD-10-CM | POA: Diagnosis not present

## 2017-04-16 DIAGNOSIS — Z1211 Encounter for screening for malignant neoplasm of colon: Secondary | ICD-10-CM | POA: Diagnosis present

## 2017-04-16 DIAGNOSIS — D128 Benign neoplasm of rectum: Secondary | ICD-10-CM

## 2017-04-16 DIAGNOSIS — D129 Benign neoplasm of anus and anal canal: Secondary | ICD-10-CM

## 2017-04-16 DIAGNOSIS — D123 Benign neoplasm of transverse colon: Secondary | ICD-10-CM

## 2017-04-16 MED ORDER — SODIUM CHLORIDE 0.9 % IV SOLN: 500.0000 mL | Freq: Once | INTRAVENOUS | Status: AC

## 2017-04-16 NOTE — Patient Instructions (Signed)
YOU HAD AN ENDOSCOPIC PROCEDURE TODAY AT [facility]:   Refer to the procedure report that was given to you for any specific questions about what was found during the examination.  If the procedure report does not answer your questions, please call your gastroenterologist to clarify.  If you requested that your care partner not be given the details of your procedure findings, then the procedure report has been included in a sealed envelope for you to review at your convenience later.  YOU SHOULD EXPECT: Some feelings of bloating in the abdomen. Passage of more gas than usual.  Walking can help get rid of the air that was put into your GI tract during the procedure and reduce the bloating. If you had a lower endoscopy (such as a colonoscopy or flexible sigmoidoscopy) you may notice spotting of blood in your stool or on the toilet paper. If you underwent a bowel prep for your procedure, you may not have a normal bowel movement for a few days.  Please Note:  You might notice some irritation and congestion in your nose or some drainage.  This is from the oxygen used during your procedure.  There is no need for concern and it should clear up in a day or so.  SYMPTOMS TO REPORT IMMEDIATELY:   Following lower endoscopy (colonoscopy or flexible sigmoidoscopy):  Excessive amounts of blood in the stool  Significant tenderness or worsening of abdominal pains  Swelling of the abdomen that is new, acute  Fever of 100F or higher   Following upper endoscopy (EGD)  Vomiting of blood or coffee ground material  New chest pain or pain under the shoulder blades  Painful or persistently difficult swallowing  New shortness of breath  Fever of 100F or higher  Black, tarry-looking stools  Please see handout on polyps.  For urgent or emergent issues, a gastroenterologist can be reached at any hour by calling [phone].   DIET:  We do recommend a small meal at first, but then you may  proceed to your regular diet.  Drink plenty of fluids but you should avoid alcoholic beverages for 24 hours.  ACTIVITY:  You should plan to take it easy for the rest of today and you should NOT DRIVE or use heavy machinery until tomorrow (because of the sedation medicines used during the test).    FOLLOW UP: Our staff will call the number listed on your records the next business day following your procedure to check on you and address any questions or concerns that you may have regarding the information given to you following your procedure. If we do not reach you, we will leave a message.  However, if you are feeling well and you are not experiencing any problems, there is no need to return our call.  We will assume that you have returned to your regular daily activities without incident.  If any biopsies were taken you will be contacted by phone or by letter within the next 1-3 weeks.  Please call us at [phone] if you have not heard about the biopsies in 3 weeks.    SIGNATURES/CONFIDENTIALITY: You and/or your care partner have signed paperwork which will be entered into your electronic medical record.  These signatures attest to the fact that that the information above on your After Visit Summary has been reviewed and is understood.  Full responsibility of the confidentiality of this discharge information lies with you and/or your care-partner.    Thank you for  allowing [place] to provide your healthcare today.

## 2017-04-16 NOTE — Op Note (Signed)
[facility] Patient Name: Marylan Glore Procedure Date: 04/16/2017 2:21 PM MRN: [id number] Endoscopist: Milus Banister , MD Age: 67 Referring MD:  Date of Birth: 1950/11/28 Gender: Female Account #: [id number] Procedure:                Upper GI endoscopy Indications:              Heartburn Medicines:                Monitored Anesthesia Care Procedure:                Pre-Anesthesia Assessment:                           - Prior to the procedure, a History and Physical                            was performed, and patient medications and                            allergies were reviewed. The patient's tolerance of                            previous anesthesia was also reviewed. The risks                            and benefits of the procedure and the sedation                            options and risks were discussed with the patient.                            All questions were answered, and informed consent                            was obtained. Prior Anticoagulants: The patient has                            taken no previous anticoagulant or antiplatelet                            agents. ASA Grade Assessment: II - A patient with                            mild systemic disease. After reviewing the risks                            and benefits, the patient was deemed in                            satisfactory condition to undergo the procedure.                           After obtaining informed consent, the endoscope was  passed under direct vision. Throughout the                            procedure, the patient's blood pressure, pulse, and                            oxygen saturations were monitored continuously. The                            Endoscope was introduced through the mouth, and                            advanced to the second part of duodenum. The upper                            GI endoscopy was accomplished without  difficulty.                            The patient tolerated the procedure well. Scope In: Scope Out: Findings:                 The esophagus was normal.                           The stomach was normal.                           The examined duodenum was normal. Complications:            No immediate complications. Estimated blood loss:                            None. Estimated Blood Loss:     Estimated blood loss: none. Impression:               - Normal esophagus.                           - Normal stomach.                           - Normal examined duodenum. Recommendation:           - Patient has a contact number available for                            emergencies. The signs and symptoms of potential                            delayed complications were discussed with the                            patient. Return to normal activities tomorrow.                            Written discharge instructions were provided to the  patient.                           - Resume previous diet.                           - Continue present medications: twice daily OTC                            omeprazole and bedtime raniditine. Milus Banister, MD 04/16/2017 3:14:01 PM This report has been signed electronically.

## 2017-04-16 NOTE — Progress Notes (Signed)
Report to PACU, RN, vss, BBS= Clear.  

## 2017-04-16 NOTE — Op Note (Signed)
[facility] Patient Name: Mackenzie Mckinney Procedure Date: 04/16/2017 2:21 PM MRN: [id number] Endoscopist: Milus Banister , MD Age: 67 Referring MD:  Date of Birth: 08/14/1950 Gender: Female Account #: [id number] Procedure:                Colonoscopy Indications:              Screening for colorectal malignant neoplasm Medicines:                Monitored Anesthesia Care Procedure:                Pre-Anesthesia Assessment:                           - Prior to the procedure, a History and Physical                            was performed, and patient medications and                            allergies were reviewed. The patient's tolerance of                            previous anesthesia was also reviewed. The risks                            and benefits of the procedure and the sedation                            options and risks were discussed with the patient.                            All questions were answered, and informed consent                            was obtained. Prior Anticoagulants: The patient has                            taken no previous anticoagulant or antiplatelet                            agents. ASA Grade Assessment: II - A patient with                            mild systemic disease. After reviewing the risks                            and benefits, the patient was deemed in                            satisfactory condition to undergo the procedure.                           After obtaining informed consent, the colonoscope  was passed under direct vision. Throughout the                            procedure, the patient's blood pressure, pulse, and                            oxygen saturations were monitored continuously. The                            Colonoscope was introduced through the anus and                            advanced to the the cecum, identified by                            appendiceal orifice and  ileocecal valve. The                            colonoscopy was performed without difficulty. The                            patient tolerated the procedure well. The quality                            of the bowel preparation was good. The ileocecal                            valve, appendiceal orifice, and rectum were                            photographed. Scope In: 2:42:56 PM Scope Out: 3:05:16 PM Scope Withdrawal Time: 0 hours 17 minutes 26 seconds  Total Procedure Duration: 0 hours 22 minutes 20 seconds  Findings:                 Five sessile polyps were found in the descending                            colon, transverse colon and ascending colon. The                            polyps were 4 to 8 mm in size. These polyps were                            removed with a cold snare. Resection and retrieval                            were complete.                           A 10 mm polyp was found in the proximal rectum. The                            polyp was sessile. The polyp was removed with  a hot                            snare. Resection and retrieval were complete.                           Left sided diverticulosis.                           The exam was otherwise without abnormality on                            direct and retroflexion views. Complications:            No immediate complications. Estimated blood loss:                            None. Estimated Blood Loss:     Estimated blood loss: none. Impression:               - Five 4 to 8 mm polyps in the descending colon, in                            the transverse colon and in the ascending colon,                            removed with a cold snare. Resected and retrieved.                           - One 10 mm polyp in the proximal rectum, removed                            with a hot snare. Resected and retrieved.                           - Diverticulosis.                           - The examination was otherwise  normal on direct                            and retroflexion views. Recommendation:           - Patient has a contact number available for                            emergencies. The signs and symptoms of potential                            delayed complications were discussed with the                            patient. Return to normal activities tomorrow.                            Written discharge instructions were provided to the  patient.                           - Resume previous diet.                           - Continue present medications.                           You will receive a letter within 2-3 weeks with the                            pathology results and my final recommendations.                           If the polyp(s) is proven to be 'pre-cancerous' on                            pathology, you will need repeat colonoscopy in 3                            years. If the polyp(s) is NOT 'precancerous' on                            pathology then you should repeat colon cancer                            screening in 10 years with colonoscopy without need                            for colon cancer screening by any method prior to                            then (including stool testing). Milus Banister, MD 04/16/2017 3:12:30 PM This report has been signed electronically.

## 2017-04-16 NOTE — Progress Notes (Signed)
Called to room to assist during endoscopic procedure.  Patient ID and intended procedure confirmed with present staff. Received instructions for my participation in the procedure from the performing physician.  

## 2017-04-19 ENCOUNTER — Telehealth: Payer: Self-pay

## 2017-04-19 NOTE — Telephone Encounter (Signed)
  Follow up Call-  Call back number 04/16/2017  Post procedure Call Back phone  # [phone]  Permission to leave phone message Yes  Some recent data might be hidden     Left message

## 2017-04-19 NOTE — Telephone Encounter (Signed)
  Follow up Call-  Call back number 04/16/2017  Post procedure Call Back phone  # [phone]  Permission to leave phone message Yes  Some recent data might be hidden     Patient questions:  Do you have a fever, pain , or abdominal swelling? No. Pain Score  0 *  Have you tolerated food without any problems? Yes.    Have you been able to return to your normal activities? Yes.    Do you have any questions about your discharge instructions: Diet   No. Medications  No. Follow up visit  Yes.    Do you have questions or concerns about your Care? Yes.    Actions: * If pain score is 4 or above: No action needed, pain <4.  Pt called and she said she had several questions re: care after her procedures.  She said Dr. Ardis Hughs came in and spoke to her briefly, but that she had a list of questions at home.  Pt will call to office this afternoon when she can get her list of questions she had prepared.  maw

## 2017-04-20 ENCOUNTER — Encounter: Payer: Self-pay | Admitting: Gastroenterology

## 2017-04-22 ENCOUNTER — Telehealth: Payer: Self-pay | Admitting: Gastroenterology

## 2017-04-22 NOTE — Telephone Encounter (Signed)
The pt was given the information regarding her colonoscopy results and her questions regarding her medications were answered. She requested an appt with Dr Ardis Hughs to discuss continued GERD.  Appt was made     Dear Mackenzie Mckinney,   Several polyps that I removed during your recent procedure were completely benign but were proven to be "pre-cancerous" polyps that MAY have grown into cancers if they had not been removed.  Studies shows that at least 20% of women over age 38 and 30% of men over age 65 have pre-cancerous polyps.  Based on current nationally recognized surveillance guidelines, I recommend that you have a repeat colonoscopy in 3 years.   If you develop any new rectal bleeding, abdominal pain or significant bowel habit changes, please contact me before then.    Sincerely,    Milus Banister, MD

## 2017-05-31 ENCOUNTER — Ambulatory Visit: Payer: Managed Care, Other (non HMO) | Admitting: Gastroenterology

## 2020-09-26 ENCOUNTER — Encounter: Payer: Self-pay | Admitting: Gastroenterology
